# Patient Record
Sex: Female | Born: 1975 | Hispanic: Yes | Marital: Married | Smoking: Never smoker
Health system: Southern US, Community
[De-identification: ages and names within clinical notes are randomized; demographics above are authoritative.]

## PROBLEM LIST (undated history)

## (undated) DIAGNOSIS — F419 Anxiety disorder, unspecified: Secondary | ICD-10-CM

## (undated) HISTORY — PX: CHOLECYSTECTOMY: SHX55

---

## 2015-03-03 ENCOUNTER — Ambulatory Visit
Admission: EM | Admit: 2015-03-03 | Discharge: 2015-03-03 | Disposition: A | Discharge status: Home / Self Care | Payer: Self-pay | Attending: Family Medicine | Admitting: Family Medicine

## 2015-03-03 DIAGNOSIS — J0110 Acute frontal sinusitis, unspecified: Secondary | ICD-10-CM

## 2015-03-03 DIAGNOSIS — J0100 Acute maxillary sinusitis, unspecified: Secondary | ICD-10-CM

## 2015-03-03 LAB — RAPID INFLUENZA A&B ANTIGENS (ARMC ONLY)
INFLUENZA A (ARMC): NOT DETECTED
INFLUENZA B (ARMC): NOT DETECTED

## 2015-03-03 LAB — RAPID STREP SCREEN (MED CTR MEBANE ONLY): STREPTOCOCCUS, GROUP A SCREEN (DIRECT): NEGATIVE

## 2015-03-03 MED ORDER — GUAIFENESIN-CODEINE 100-10 MG/5ML PO SOLN: ORAL | Status: DC

## 2015-03-03 MED ORDER — AMOXICILLIN 875 MG PO TABS: 875.0000 mg | ORAL_TABLET | Freq: Two times a day (BID) | ORAL | Status: DC

## 2015-03-03 NOTE — ED Provider Notes (Signed)
CSN: [id number]     Arrival date & time 03/03/15  1539 History   First MD Initiated Contact with Patient 03/03/15 1619     Chief Complaint  Patient presents with  . URI   (Consider location/radiation/quality/duration/timing/severity/associated sxs/prior Treatment) Patient is a 40 y.o. female presenting with URI. The history is provided by the patient.  URI Presenting symptoms: congestion, cough, facial pain, fatigue, fever and sore throat   Severity:  Moderate Duration:  1 week Timing:  Constant Progression:  Worsening Chronicity:  New Relieved by:  Nothing Ineffective treatments:  OTC medications Associated symptoms: headaches   Associated symptoms: no wheezing   Risk factors: sick contacts   Risk factors: not elderly, no chronic cardiac disease, no chronic kidney disease, no chronic respiratory disease, no diabetes mellitus, no immunosuppression and no recent illness     History reviewed. No pertinent past medical history. Past Surgical History  Procedure Laterality Date  . Cholecystectomy    . Cesarean section     History reviewed. No pertinent family history. Social History  Substance Use Topics  . Smoking status: Never Smoker   . Smokeless tobacco: Never Used  . Alcohol Use: No   OB History    No data available     Review of Systems  Constitutional: Positive for fever and fatigue.  HENT: Positive for congestion and sore throat.   Respiratory: Positive for cough. Negative for wheezing.   Neurological: Positive for headaches.    Allergies  Review of patient's allergies indicates no known allergies.  Home Medications   Prior to Admission medications   Medication Sig Start Date End Date Taking? Authorizing Provider  amoxicillin (AMOXIL) 875 MG tablet Take 1 tablet (875 mg total) by mouth 2 (two) times daily. 03/03/15   Payton Mccallum, MD  guaiFENesin-codeine 100-10 MG/5ML syrup 10 ml po qhs prn cough 03/03/15   Payton Mccallum, MD   Meds Ordered and Administered  this Visit  Medications - No data to display  BP 134/60 mmHg  Pulse 87  Temp(Src) 98 F (36.7 C) (Oral)  Resp 18  Ht 5' (1.524 m)  Wt 130 lb (58.968 kg)  BMI 25.39 kg/m2  SpO2 99%  LMP 02/20/2015 No data found.   Physical Exam  Constitutional: She appears well-developed and well-nourished. No distress.  HENT:  Head: Normocephalic and atraumatic.  Right Ear: Tympanic membrane, external ear and ear canal normal.  Left Ear: Tympanic membrane, external ear and ear canal normal.  Nose: Mucosal edema and rhinorrhea present. No nose lacerations, sinus tenderness, nasal deformity, septal deviation or nasal septal hematoma. No epistaxis.  No foreign bodies. Right sinus exhibits maxillary sinus tenderness and frontal sinus tenderness. Left sinus exhibits maxillary sinus tenderness and frontal sinus tenderness.  Mouth/Throat: Uvula is midline, oropharynx is clear and moist and mucous membranes are normal. No oropharyngeal exudate.  Eyes: Conjunctivae and EOM are normal. Pupils are equal, round, and reactive to light. Right eye exhibits no discharge. Left eye exhibits no discharge. No scleral icterus.  Neck: Normal range of motion. Neck supple. No thyromegaly present.  Cardiovascular: Normal rate, regular rhythm and normal heart sounds.   Pulmonary/Chest: Effort normal and breath sounds normal. No respiratory distress. She has no wheezes. She has no rales.  Lymphadenopathy:    She has no cervical adenopathy.  Skin: She is not diaphoretic.  Nursing note and vitals reviewed.   ED Course  Procedures (including critical care time)  Labs Review Labs Reviewed  RAPID INFLUENZA A&B ANTIGENS ([facility] ONLY)  RAPID STREP SCREEN (NOT AT [facility])  CULTURE, GROUP A STREP [facility])    Imaging Review No results found.   Visual Acuity Review  Right Eye Distance:   Left Eye Distance:   Bilateral Distance:    Right Eye Near:   Left Eye Near:    Bilateral Near:         MDM   1. Acute maxillary  sinusitis, recurrence not specified   2. Acute frontal sinusitis, recurrence not specified    Discharge Medication List as of 03/03/2015  5:03 PM    START taking these medications   Details  amoxicillin (AMOXIL) 875 MG tablet Take 1 tablet (875 mg total) by mouth 2 (two) times daily., Starting 03/03/2015, Until Discontinued, Print    guaiFENesin-codeine 100-10 MG/5ML syrup 10 ml po qhs prn cough, Print       1. Labs and diagnosis reviewed with patient/parent/guardian/family 2. rx as per orders above; reviewed possible side effects, interactions, risks and benefits  3. Recommend supportive treatment with rest, increased fluids, otc analgesics prn  4. Follow-up prn if symptoms worsen or don't improve    Payton Mccallum, MD 03/03/15 1707

## 2015-03-03 NOTE — ED Notes (Signed)
Patient c/o cough, runny nose, sore throat, and fatigue which started 6 days ago.  Denies fever/v/n/chest pain but does have chills.

## 2015-03-05 LAB — CULTURE, GROUP A STREP (THRC)

## 2016-01-31 ENCOUNTER — Ambulatory Visit
Admission: EM | Admit: 2016-01-31 | Discharge: 2016-01-31 | Disposition: A | Discharge status: Home / Self Care | Payer: Self-pay | Attending: Family Medicine | Admitting: Family Medicine

## 2016-01-31 ENCOUNTER — Encounter: Payer: Self-pay | Admitting: Emergency Medicine

## 2016-01-31 ENCOUNTER — Ambulatory Visit (INDEPENDENT_AMBULATORY_CARE_PROVIDER_SITE_OTHER): Payer: Self-pay

## 2016-01-31 DIAGNOSIS — J4521 Mild intermittent asthma with (acute) exacerbation: Secondary | ICD-10-CM

## 2016-01-31 MED ORDER — PREDNISONE 20 MG PO TABS: ORAL_TABLET | ORAL | 0 refills | Status: DC

## 2016-01-31 MED ORDER — AZITHROMYCIN 250 MG PO TABS
ORAL_TABLET | ORAL | 0 refills | Status: DC
Start: 2016-01-31 — End: 2017-12-24

## 2016-01-31 MED ORDER — ALBUTEROL SULFATE HFA 108 (90 BASE) MCG/ACT IN AERS: 1.0000 | INHALATION_SPRAY | Freq: Four times a day (QID) | RESPIRATORY_TRACT | 0 refills | Status: AC | PRN

## 2016-01-31 MED ORDER — IPRATROPIUM-ALBUTEROL 0.5-2.5 (3) MG/3ML IN SOLN
3.0000 mL | Freq: Once | RESPIRATORY_TRACT | Status: AC
  Administered 2016-01-31: 3 mL via RESPIRATORY_TRACT

## 2016-01-31 MED ORDER — IBUPROFEN 600 MG PO TABS
600.0000 mg | ORAL_TABLET | Freq: Once | ORAL | Status: AC
  Administered 2016-01-31: 600 mg via ORAL

## 2016-01-31 NOTE — ED Triage Notes (Signed)
Cough, headache, congested for 2 weeks

## 2016-01-31 NOTE — ED Provider Notes (Signed)
CSN: [id number]     Arrival date & time 01/31/16  1226 History   First MD Initiated Contact with Patient 01/31/16 1502     Chief Complaint  Patient presents with  . Cough   (Consider location/radiation/quality/duration/timing/severity/associated sxs/prior Treatment) HPI  41 year old female presents with cough headache and congestion for 2 weeks duration. The cough has been present for [redacted] weeks along with wheezing and a headache has just started recently. She's not had any chills but has felt feverish at times. His never smoked and her O2 sats today are 95% on room air. Had wheezing throughout the entire 2 weeks. She relates that she has had asthma as a child. The coughing is much worse at nighttime along with wheezing. She states that her entire family has a history of asthmatic bronchitis.      History reviewed. No pertinent past medical history. Past Surgical History:  Procedure Laterality Date  . CESAREAN SECTION    . CHOLECYSTECTOMY     No family history on file. Social History  Substance Use Topics  . Smoking status: Never Smoker  . Smokeless tobacco: Never Used  . Alcohol use No   OB History    No data available     Review of Systems  Constitutional: Positive for activity change, fatigue and fever. Negative for chills.  HENT: Positive for congestion, sinus pain and sinus pressure.   Respiratory: Positive for cough, shortness of breath and wheezing. Negative for stridor.   Neurological: Positive for headaches.  All other systems reviewed and are negative.   Allergies  Patient has no known allergies.  Home Medications   Prior to Admission medications   Medication Sig Start Date End Date Taking? Authorizing Provider  albuterol (PROVENTIL HFA;VENTOLIN HFA) 108 (90 Base) MCG/ACT inhaler Inhale 1-2 puffs into the lungs every 6 (six) hours as needed for wheezing or shortness of breath. Use with spacer 01/31/16   Lutricia Feil, PA-C  azithromycin (ZITHROMAX Z-PAK) 250  MG tablet Take as per package instructions 01/31/16   Lutricia Feil, PA-C  predniSONE (DELTASONE) 20 MG tablet Take 2 tablets (40 mg) daily by mouth 01/31/16   Lutricia Feil, PA-C   Meds Ordered and Administered this Visit   Medications  ipratropium-albuterol (DUONEB) 0.5-2.5 (3) MG/3ML nebulizer solution 3 mL (3 mLs Nebulization Given 01/31/16 1532)  ibuprofen (ADVIL,MOTRIN) tablet 600 mg (600 mg Oral Given 01/31/16 1531)    BP 125/68 (BP Location: Left Arm)   Pulse 62   Temp 97.6 F (36.4 C) (Tympanic)   Resp 16   Ht 5' (1.524 m)   Wt 130 lb (59 kg)   LMP  (LMP Unknown) Comment: tubal ligation  SpO2 95%   BMI 25.39 kg/m  No data found.   Physical Exam  Constitutional: She is oriented to person, place, and time. She appears well-developed and well-nourished. No distress.  HENT:  Head: Normocephalic and atraumatic.  Right Ear: External ear normal.  Left Ear: External ear normal.  Nose: Nose normal.  Mouth/Throat: Oropharynx is clear and moist. No oropharyngeal exudate.  Eyes: EOM are normal. Pupils are equal, round, and reactive to light. Right eye exhibits no discharge. Left eye exhibits no discharge.  Neck: Normal range of motion. Neck supple.  Pulmonary/Chest: Effort normal. No respiratory distress. She has wheezes. She has no rales.  Musculoskeletal: Normal range of motion.  Lymphadenopathy:    She has no cervical adenopathy.  Neurological: She is alert and oriented to person, place, and time.  Skin:  Skin is warm and dry. She is not diaphoretic.  Psychiatric: She has a normal mood and affect. Her behavior is normal. Judgment and thought content normal.  Nursing note and vitals reviewed.   Urgent Care Course   Clinical Course     Procedures (including critical care time)  Labs Review Labs Reviewed - No data to display  Imaging Review Dg Chest 2 View  Result Date: 01/31/2016 CLINICAL DATA:  Cough for 2.5 weeks. EXAM: CHEST  2 VIEW COMPARISON:  None.  FINDINGS: Lungs clear. Heart size normal. No pneumothorax or pleural effusion. No bony abnormality. IMPRESSION: No acute disease. Electronically Signed   By: Drusilla Kannerhomas  Dalessio M.D.   On: 01/31/2016 15:27     Visual Acuity Review  Right Eye Distance:   Left Eye Distance:   Bilateral Distance:    Right Eye Near:   Left Eye Near:    Bilateral Near:     Medications  ipratropium-albuterol (DUONEB) 0.5-2.5 (3) MG/3ML nebulizer solution 3 mL (3 mLs Nebulization Given 01/31/16 1532)  ibuprofen (ADVIL,MOTRIN) tablet 600 mg (600 mg Oral Given 01/31/16 1531)   Patient had very good results with the treatment having much less wheezing and breathing easier.   MDM   1. Mild intermittent asthmatic bronchitis with acute exacerbation    New Prescriptions   ALBUTEROL (PROVENTIL HFA;VENTOLIN HFA) 108 (90 BASE) MCG/ACT INHALER    Inhale 1-2 puffs into the lungs every 6 (six) hours as needed for wheezing or shortness of breath. Use with spacer   AZITHROMYCIN (ZITHROMAX Z-PAK) 250 MG TABLET    Take as per package instructions   PREDNISONE (DELTASONE) 20 MG TABLET    Take 2 tablets (40 mg) daily by mouth  Plan: 1. Test/x-ray results and diagnosis reviewed with patient 2. rx as per orders; risks, benefits, potential side effects reviewed with patient 3. Recommend supportive treatment with Use of albuterol for her shortness of breath and wheezing. We'll give her a short course of prednisone for the bronchitis. Recommended that she follow-up with her primary care physician next week for further evaluation and treatment 4. F/u prn if symptoms worsen or don't improve     Lutricia FeilWilliam P Dahiana Kulak, PA-C 01/31/16 1606

## 2017-12-24 ENCOUNTER — Other Ambulatory Visit: Payer: Self-pay

## 2017-12-24 ENCOUNTER — Ambulatory Visit
Admission: EM | Admit: 2017-12-24 | Discharge: 2017-12-24 | Disposition: A | Discharge status: Home / Self Care | Payer: Self-pay | Attending: Family Medicine | Admitting: Family Medicine

## 2017-12-24 DIAGNOSIS — W540XXA Bitten by dog, initial encounter: Secondary | ICD-10-CM

## 2017-12-24 DIAGNOSIS — S61051A Open bite of right thumb without damage to nail, initial encounter: Secondary | ICD-10-CM

## 2017-12-24 NOTE — ED Provider Notes (Signed)
[facility] ____________________________________________  Time seen: Approximately 6:18 PM  I have reviewed the triage vital signs and the nursing notes.   HISTORY  Chief Complaint Animal Bite   HPI Sydney Reed is a 42 y.o. female presenting for evaluation of right thumb wound due to dog bite that occurred last night.  States she and her husband were helping to disimpact her 7 pound shih-tzu, and states that her dog turned and bit once at her right hand.  States the dog did not actually bite down on her thumb, but states that she believes the tooth hit it and as she quickly jerked her hand away caused the skin injury.  States mild pain to the thumb at skin injury site only.  Denies any drainage, paresthesias, decreased range of motion.  Reports she has had mild bleeding intermittently since.  Has cleaned it at home and applied topical antibiotic ointment.  Patient's last tetanus immunization was in 2017.  Reports her dog is up-to-date on immunizations including rabies immunizations.  Denies any other skin changes or injury.  Left hand dominant.  States mild pain currently.   Patient reports that she has a history of asthma and bronchitis, and she is currently sick with bronchitis and is currently on antibiotics and prednisone.  States that she was seen by her doctor on 12/20/2017 and started on a 10-day course of Augmentin as well as prednisone, and states that she is feeling much better.  Denies chest pain or current shortness of breath.  [place]: PCP    History reviewed. No pertinent past medical history.  There are no active problems to display for this patient.   Past Surgical History:  Procedure Laterality Date  . CESAREAN SECTION    . CHOLECYSTECTOMY       No current facility-administered medications for this encounter.   Current Outpatient Medications:  .  albuterol (PROVENTIL HFA;VENTOLIN HFA) 108 (90 Base) MCG/ACT inhaler, Inhale 1-2  puffs into the lungs every 6 (six) hours as needed for wheezing or shortness of breath. Use with spacer, Disp: 1 Inhaler, Rfl: 0 .  amoxicillin-clavulanate (AUGMENTIN) 875-125 MG tablet, Take 1 tablet by mouth every 12 (twelve) hours., Disp: , Rfl: 0 .  fexofenadine (ALLEGRA) 180 MG tablet, Take by mouth., Disp: , Rfl:  .  naproxen (NAPROSYN) 500 MG tablet, Take 500 mg by mouth 2 (two) times daily with a meal., Disp: , Rfl: 0 .  predniSONE (DELTASONE) 20 MG tablet, Take 2 tablets (40 mg) daily by mouth, Disp: 8 tablet, Rfl: 0 .  sertraline (ZOLOFT) 50 MG tablet, Take 50 mg by mouth daily., Disp: , Rfl: 3  Allergies Patient has no known allergies.  History reviewed. No pertinent family history.  Social History Social History   Tobacco Use  . Smoking status: Never Smoker  . Smokeless tobacco: Never Used  Substance Use Topics  . Alcohol use: No  . Drug use: No    Review of Systems Constitutional: No fever Cardiovascular: Denies chest pain. Respiratory: Denies shortness of breath. Gastrointestinal: No abdominal pain.   Musculoskeletal: Right thumb pain. Skin: as above.  ____________________________________________   PHYSICAL EXAM:  VITAL SIGNS: ED Triage Vitals  Enc Vitals Group     BP 12/24/17 1737 136/78     Pulse Rate 12/24/17 1737 72     Resp 12/24/17 1737 18     Temp 12/24/17 1737 (!) 97.5 F (36.4 C)     Temp Source 12/24/17 1737 Oral  SpO2 12/24/17 1737 99 %     Weight 12/24/17 1737 139 lb (63 kg)     Height 12/24/17 1737 5' (1.524 m)     Head Circumference --      Peak Flow --      Pain Score 12/24/17 1736 5     Pain Loc --      Pain Edu? --      Excl. in [place]? --     Constitutional: Alert and oriented. Well appearing and in no acute distress. ENT      Head: Normocephalic and atraumatic. Cardiovascular: Normal rate, regular rhythm. Grossly normal heart sounds.  Good peripheral circulation. Respiratory: Normal respiratory effort without tachypnea nor  retractions.  Minimal scattered inspiratory and expiratory wheezes.  No rhonchi.  Speaks in complete sentences. Musculoskeletal: Steady gait.  See skin below. Neurologic:  Normal speech and language. No gross focal neurologic deficits are appreciated. Speech is normal. No gait instability.  Skin:  Skin is warm, dry.  Except:      1.25 cm superficial break in skin as depicted above along right thumb base of nail, mild tenderness to direct palpation, no tenderness from the volar aspect of distal phalanx, no pain with resisted thumb flexion or extension, no clear bony tenderness, no exudate, minimal erythema, minimal localized edema.  Right hand thumb normal distal sensation and capillary refill, no motor or tendon deficit. Psychiatric: Mood and affect are normal. Speech and behavior are normal. Patient exhibits appropriate insight and judgment   ___________________________________________   LABS (all labs ordered are listed, but only abnormal results are displayed)  Labs Reviewed - No data to display ____________________________________________   PROCEDURES Procedures   Wound cleaned and irrigated by nursing staff, bandage applied.  INITIAL IMPRESSION / ASSESSMENT AND PLAN / ED COURSE  Pertinent labs & imaging results that were available during my care of the patient were reviewed by me and considered in my medical decision making (see chart for details).  Well-appearing patient.  No acute distress.  Dog bite to right thumb.  Occurred by her dog.  Reports dog is up-to-date on rabies immunization.  Patient reports she is up-to-date on tetanus with last tetanus immunization being 2 years ago.  Right thumb abrasion like injury to the distal phalanx at the base of nail, area cleaned.  Patient with localized tenderness dorsally, but no clear bony tenderness, and patient states no pain with range of motion, and further reports that her dog is 7 pounds.  Will defer x-ray at this time, patient  agrees to this.  Patient is already on Augmentin and at the time of dog bite last night she had 7 days remaining.  Tetanus immunization is up-to-date.  Discuss prescribing topical Bactroban, patient then states that that is what she is using as her husband had leftover and declines further prescription.  Encourage supportive care, keeping clean and monitoring.  Animal control notified by nursing staff.  Discussed follow up with Primary care physician this week. Discussed follow up and return parameters including no resolution or any worsening concerns. Patient verbalized understanding and agreed to plan.   ____________________________________________   FINAL CLINICAL IMPRESSION(S) / ED DIAGNOSES  Final diagnoses:  Dog bite of right thumb, initial encounter     ED Discharge Orders    None       Note: This dictation was prepared with Dragon dictation along with smaller phrase technology. Any transcriptional errors that result from this process are unintentional.  Renford Dills, NP 12/24/17 1909

## 2017-12-24 NOTE — ED Triage Notes (Signed)
Patient complains of dog bite that occurred last night by her own dog. Patient has a small bite to her thumb. Patient states that she is already on amoxicillin and prednisone due to bronchitis. Patient states that this occurred at her house while she was helping disimpact her dog.

## 2017-12-24 NOTE — Discharge Instructions (Addendum)
Continue to clean with soap and water.  Continue Augmentin as well as topical mupirocin twice a day.  Monitor.  Follow up with your primary care physician this week as needed. Return to Urgent care for new or worsening concerns.

## 2018-07-13 ENCOUNTER — Ambulatory Visit
Admission: EM | Admit: 2018-07-13 | Discharge: 2018-07-13 | Disposition: A | Discharge status: Home / Self Care | Payer: Self-pay | Attending: Family Medicine | Admitting: Family Medicine

## 2018-07-13 ENCOUNTER — Encounter: Payer: Self-pay | Admitting: Emergency Medicine

## 2018-07-13 ENCOUNTER — Other Ambulatory Visit: Payer: Self-pay

## 2018-07-13 DIAGNOSIS — M7918 Myalgia, other site: Secondary | ICD-10-CM

## 2018-07-13 DIAGNOSIS — X500XXA Overexertion from strenuous movement or load, initial encounter: Secondary | ICD-10-CM

## 2018-07-13 HISTORY — DX: Anxiety disorder, unspecified: F41.9

## 2018-07-13 MED ORDER — MELOXICAM 15 MG PO TABS: 15.0000 mg | ORAL_TABLET | Freq: Every day | ORAL | 0 refills | Status: DC | PRN

## 2018-07-13 MED ORDER — TRAMADOL HCL 50 MG PO TABS: 50.0000 mg | ORAL_TABLET | Freq: Three times a day (TID) | ORAL | 0 refills | Status: DC | PRN

## 2018-07-13 NOTE — ED Provider Notes (Signed)
[facility]    CSN: [id number] Arrival date & time: 07/13/18  1132  History   Chief Complaint Chief Complaint  Patient presents with  . Abdominal Pain   HPI  43 year old female presents with left-sided rib/abdominal pain and back pain.  Started approximately 1 week ago but worsened over the weekend.  Patient attributes this to frequent bending and lifting at work.  She states that it is worse with certain ranges of motion.  Tender to palpation.  No medications or interventions tried.  No reports of nausea or vomiting.  No fever.  Patient states that her pain is currently 7/10 in severity.  No other reported symptoms.  No other complaints.  PMH, Surgical Hx, Family Hx, Social History reviewed and updated as below.   Past Medical History:  Diagnosis Date  . Anxiety    Past Surgical History:  Procedure Laterality Date  . CESAREAN SECTION    . CHOLECYSTECTOMY      OB History   No obstetric history on file.      Home Medications    Prior to Admission medications   Medication Sig Start Date End Date Taking? Authorizing Provider  albuterol (PROVENTIL HFA;VENTOLIN HFA) 108 (90 Base) MCG/ACT inhaler Inhale 1-2 puffs into the lungs every 6 (six) hours as needed for wheezing or shortness of breath. Use with spacer 01/31/16  Yes Lorin Picket, PA-C  fexofenadine (ALLEGRA) 180 MG tablet Take by mouth.   Yes [provider]  sertraline (ZOLOFT) 50 MG tablet Take 50 mg by mouth daily. 12/17/17  Yes [provider]  meloxicam (MOBIC) 15 MG tablet Take 1 tablet (15 mg total) by mouth daily as needed for pain. 07/13/18   Coral Spikes, DO  traMADol (ULTRAM) 50 MG tablet Take 1 tablet (50 mg total) by mouth every 8 (eight) hours as needed for severe pain. 07/13/18   Coral Spikes, DO    Family History Family History  Problem Relation Age of Onset  . Diabetes Mother   . Hypertension Mother   . Cancer Father     Social History Social History    Tobacco Use  . Smoking status: Never Smoker  . Smokeless tobacco: Never Used  Substance Use Topics  . Alcohol use: No  . Drug use: No     Allergies   Patient has no known allergies.   Review of Systems Review of Systems  Constitutional: Negative.   Musculoskeletal:       Rib pain, back pain.   Physical Exam Triage Vital Signs ED Triage Vitals  Enc Vitals Group     BP 07/13/18 1205 119/68     Pulse Rate 07/13/18 1205 67     Resp 07/13/18 1205 16     Temp 07/13/18 1205 98.1 F (36.7 C)     Temp Source 07/13/18 1205 Oral     SpO2 07/13/18 1205 100 %     Weight 07/13/18 1208 139 lb (63 kg)     Height 07/13/18 1208 5' (1.524 m)     Head Circumference --      Peak Flow --      Pain Score 07/13/18 1207 7     Pain Loc --      Pain Edu? --      Excl. in [place]? --    Updated Vital Signs BP 119/68   Pulse 67   Temp 98.1 F (36.7 C) (Oral)   Resp 16   Ht 5' (1.524 m)  Wt 63 kg   LMP 06/29/2018 (Exact Date)   SpO2 100%   BMI 27.15 kg/m   Visual Acuity Right Eye Distance:   Left Eye Distance:   Bilateral Distance:    Right Eye Near:   Left Eye Near:    Bilateral Near:     Physical Exam Vitals signs and nursing note reviewed.  Constitutional:      General: She is not in acute distress.    Appearance: Normal appearance.  HENT:     Head: Normocephalic and atraumatic.  Eyes:     General:        Right eye: No discharge.        Left eye: No discharge.     Conjunctiva/sclera: Conjunctivae normal.  Cardiovascular:     Rate and Rhythm: Normal rate and regular rhythm.  Pulmonary:     Effort: Pulmonary effort is normal.     Breath sounds: Normal breath sounds.  Musculoskeletal:     Comments: Tenderness over the left lower ribs.  Patient also has left-sided lower thoracic tenderness to palpation.  Neurological:     Mental Status: She is alert.  Psychiatric:        Mood and Affect: Mood normal.        Behavior: Behavior normal.    UC Treatments / Results   Labs (all labs ordered are listed, but only abnormal results are displayed) Labs Reviewed - No data to display  EKG None  Radiology No results found.  Procedures Procedures (including critical care time)  Medications Ordered in UC Medications - No data to display  Initial Impression / Assessment and Plan / UC Course  I have reviewed the triage vital signs and the nursing notes.  Pertinent labs & imaging results that were available during my care of the patient were reviewed by me and considered in my medical decision making (see chart for details).    43 year old female presents with musculoskeletal pain.  Treating with meloxicam and tramadol.  Supportive care.  Final Clinical Impressions(s) / UC Diagnoses   Final diagnoses:  Musculoskeletal pain     Discharge Instructions     Rest.  Heat.  Medications as directed.  Take care  Dr. Adriana Simasook    ED Prescriptions    Medication Sig Dispense Auth. Provider   meloxicam (MOBIC) 15 MG tablet Take 1 tablet (15 mg total) by mouth daily as needed for pain. 30 tablet Karam Dunson G, DO   traMADol (ULTRAM) 50 MG tablet Take 1 tablet (50 mg total) by mouth every 8 (eight) hours as needed for severe pain. 10 tablet Tommie Samsook, Maksymilian Mabey G, DO     Controlled Substance Prescriptions [place] Controlled Substance Registry consulted? Not Applicable   Tommie SamsCook, Rutilio Yellowhair G, DO 07/13/18 1514

## 2018-07-13 NOTE — Discharge Instructions (Signed)
Rest.  Heat.  Medications as directed.  Take care  Dr. Yosef Krogh  

## 2018-07-13 NOTE — ED Triage Notes (Signed)
Patient states she has had left abdominal pain for a week. States she did a lot of lifting and bending at work and then developed the pain.

## 2018-07-14 ENCOUNTER — Other Ambulatory Visit: Payer: Self-pay

## 2018-07-14 ENCOUNTER — Ambulatory Visit
Admission: EM | Admit: 2018-07-14 | Discharge: 2018-07-14 | Disposition: A | Discharge status: Home / Self Care | Payer: Self-pay | Attending: Emergency Medicine | Admitting: Emergency Medicine

## 2018-07-14 ENCOUNTER — Encounter: Payer: Self-pay | Admitting: Emergency Medicine

## 2018-07-14 DIAGNOSIS — T887XXA Unspecified adverse effect of drug or medicament, initial encounter: Secondary | ICD-10-CM

## 2018-07-14 DIAGNOSIS — R42 Dizziness and giddiness: Secondary | ICD-10-CM

## 2018-07-14 DIAGNOSIS — R110 Nausea: Secondary | ICD-10-CM

## 2018-07-14 MED ORDER — IBUPROFEN 600 MG PO TABS: 600.0000 mg | ORAL_TABLET | Freq: Four times a day (QID) | ORAL | 0 refills | Status: AC | PRN

## 2018-07-14 MED ORDER — ONDANSETRON 4 MG PO TBDP: 4.0000 mg | ORAL_TABLET | Freq: Three times a day (TID) | ORAL | 0 refills | Status: AC | PRN

## 2018-07-14 MED ORDER — TIZANIDINE HCL 4 MG PO TABS
4.0000 mg | ORAL_TABLET | Freq: Three times a day (TID) | ORAL | 0 refills | Status: AC | PRN
Start: 2018-07-14 — End: ?

## 2018-07-14 MED ORDER — EPINEPHRINE 0.3 MG/0.3ML IJ SOAJ: 0.3000 mg | Freq: Once | INTRAMUSCULAR | 0 refills | Status: AC

## 2018-07-14 NOTE — ED Provider Notes (Signed)
HPI  SUBJECTIVE:  Sydney Reed is a 43 y.o. female who presents with leg itching starting 30 minutes after taking tramadol and meloxicam last night.  Took another dose of tramadol at 10 PM, and had persistent itching.  She took tramadol and meloxicam this morning with food and 2 hours later she started feeling lightheaded, dizzy with chills, nausea, headache, diarrhea and persistent itching.  No fevers, vomiting, rash, erythema, hives, lip or tongue swelling, difficulty breathing, wheezing, shortness of breath, sensation of throat swelling shut, abdominal pain, syncope.  No seizures.  The pain that she was seen here for yesterday has not changed.  No new foods, other new medications.  She takes Care[employer] adviserAllegra and Zoloft on a regular basis.  She tried drinking water without improvement in her symptoms.  No other aggravating or alleviating factors.  Past medical history of allergies, anxiety.  No history of anaphylaxis.  She has tolerated NSAIDs including ibuprofen well in the past.  Patient was seen here for left-sided rib/abdominal and back pain.  Thought to have a musculoskeletal cause, sent home with meloxicam and tramadol.  Past Medical History:  Diagnosis Date  . Anxiety     Past Surgical History:  Procedure Laterality Date  . CESAREAN SECTION    . CHOLECYSTECTOMY      Family History  Problem Relation Age of Onset  . Diabetes Mother   . Hypertension Mother   . Cancer Father     Social History   Tobacco Use  . Smoking status: Never Smoker  . Smokeless tobacco: Never Used  Substance Use Topics  . Alcohol use: No  . Drug use: No    No current facility-administered medications for this encounter.   Current Outpatient Medications:  .  albuterol (PROVENTIL HFA;VENTOLIN HFA) 108 (90 Base) MCG/ACT inhaler, Inhale 1-2 puffs into the lungs every 6 (six) hours as needed for wheezing or shortness of breath. Use with spacer, Disp: 1 Inhaler, Rfl: 0 .  fexofenadine (ALLEGRA) 180 MG tablet,  Take by mouth., Disp: , Rfl:  .  sertraline (ZOLOFT) 50 MG tablet, Take 50 mg by mouth daily., Disp: , Rfl: 3 .  EPINEPHrine 0.3 mg/0.3 mL IJ SOAJ injection, Inject 0.3 mLs (0.3 mg total) into the muscle once for 1 dose., Disp: 1 each, Rfl: 0 .  ibuprofen (ADVIL) 600 MG tablet, Take 1 tablet (600 mg total) by mouth every 6 (six) hours as needed., Disp: 30 tablet, Rfl: 0 .  ondansetron (ZOFRAN ODT) 4 MG disintegrating tablet, Take 1 tablet (4 mg total) by mouth every 8 (eight) hours as needed for nausea or vomiting., Disp: 20 tablet, Rfl: 0 .  tiZANidine (ZANAFLEX) 4 MG tablet, Take 1 tablet (4 mg total) by mouth every 8 (eight) hours as needed for muscle spasms., Disp: 30 tablet, Rfl: 0  No Known Allergies   ROS  As noted in HPI.   Physical Exam  BP 128/78 (BP Location: Right Arm)   Pulse 82   Temp 97.8 F (36.6 C) (Oral)   Resp 18   Ht 5' (1.524 m)   Wt 63 kg   LMP 06/29/2018 (Exact Date)   SpO2 100%   BMI 27.15 kg/m   Constitutional: Well developed, well nourished, no acute distress Eyes:  EOMI, conjunctiva normal bilaterally HENT: Normocephalic, atraumatic,mucus membranes moist.  No angioedema of the lips or tongue.  Airway widely patent. Respiratory: Normal inspiratory effort lungs clear bilaterally. Cardiovascular: Normal rate, regular rhythm, no murmurs rubs or gallops. GI: nondistended soft, nontender, active  bowel sounds.   skin: No rash, urticaria, erythema over the torso, extremities or face. Musculoskeletal: no deformities Neurologic: Alert & oriented x 3, no focal neuro deficits Psychiatric: Speech and behavior appropriate   ED Course   Medications - No data to display  No orders of the defined types were placed in this encounter.   No results found for this or any previous visit (from the past 24 hour(s)). No results found.  ED Clinical Impression  1. Side effect of medication      ED Assessment/Plan  Previous records from this office reviewed.   As noted in HPI.  Suspect symptoms are side effects from the tramadol.  No evidence of anaphylaxis at this time.  She has tolerated NSAIDs including ibuprofen without any problem previously.  Discontinue the Mobic, tramadol.  Will send home with ibuprofen 600 mg, with 1 g of Tylenol together 3-4 times a day for musculoskeletal pain, Zanaflex as well.  Zofran in case she has persistent nausea although I suspect that the nausea will resolve as the drug gets out of her system.  Also an EpiPen in case she has a anaphylaxis.  Instructed her to use this, take 50 milligrams of Benadryl and go to the ER if this happens.  Discussed MDM, treatment plan, and plan for follow-up with patient. Discussed sn/sx that should prompt return to the ED. patient agrees with plan.   Meds ordered this encounter  Medications  . ondansetron (ZOFRAN ODT) 4 MG disintegrating tablet    Sig: Take 1 tablet (4 mg total) by mouth every 8 (eight) hours as needed for nausea or vomiting.    Dispense:  20 tablet    Refill:  0  . ibuprofen (ADVIL) 600 MG tablet    Sig: Take 1 tablet (600 mg total) by mouth every 6 (six) hours as needed.    Dispense:  30 tablet    Refill:  0  . tiZANidine (ZANAFLEX) 4 MG tablet    Sig: Take 1 tablet (4 mg total) by mouth every 8 (eight) hours as needed for muscle spasms.    Dispense:  30 tablet    Refill:  0  . EPINEPHrine 0.3 mg/0.3 mL IJ SOAJ injection    Sig: Inject 0.3 mLs (0.3 mg total) into the muscle once for 1 dose.    Dispense:  1 each    Refill:  0    *This clinic note was created using [employer]. Therefore, there may be occasional mistakes despite careful proofreading.   ?    Melynda Ripple, MD 07/14/18 1108

## 2018-07-14 NOTE — Discharge Instructions (Addendum)
The Zofran will help with nausea, however as the tramadol wears off, you should be feeling better.  Take 600 mg ibuprofen combined with 1 g of Tylenol 3-4 times a day for inflammation and pain.  Zanaflex as a muscle relaxant will also help with pain.  I am sending you home with an EpiPen in case you do have a severe allergic reaction, such as tongue or lip swelling, hives everywhere, difficulty breathing.  Take 50 mg of Benadryl, use the EpiPen, and go to the emergency department.

## 2018-07-14 NOTE — ED Triage Notes (Signed)
Patient states she was seen here yesterday for side pain and diagnosed with muscle strain.  Patient was given Meloxicam and Tramadol. She states she started itching after she took the medications. She states she took both of the pills this morning and stated she started having chills, diarrhea and nausea. Patient states she was feeling fine until she took these medications.

## 2018-08-03 ENCOUNTER — Other Ambulatory Visit: Payer: Self-pay

## 2018-08-03 DIAGNOSIS — Z20822 Contact with and (suspected) exposure to covid-19: Secondary | ICD-10-CM

## 2018-08-07 LAB — NOVEL CORONAVIRUS, NAA: SARS-CoV-2, NAA: NOT DETECTED

## 2018-08-13 IMAGING — CR DG CHEST 2V
2 series · 2 of 2 positions shown · non-contrast
Comparison: None.

CLINICAL DATA: Cough for 2.5 weeks.

EXAM:
CHEST  2 VIEW

[chest pa]
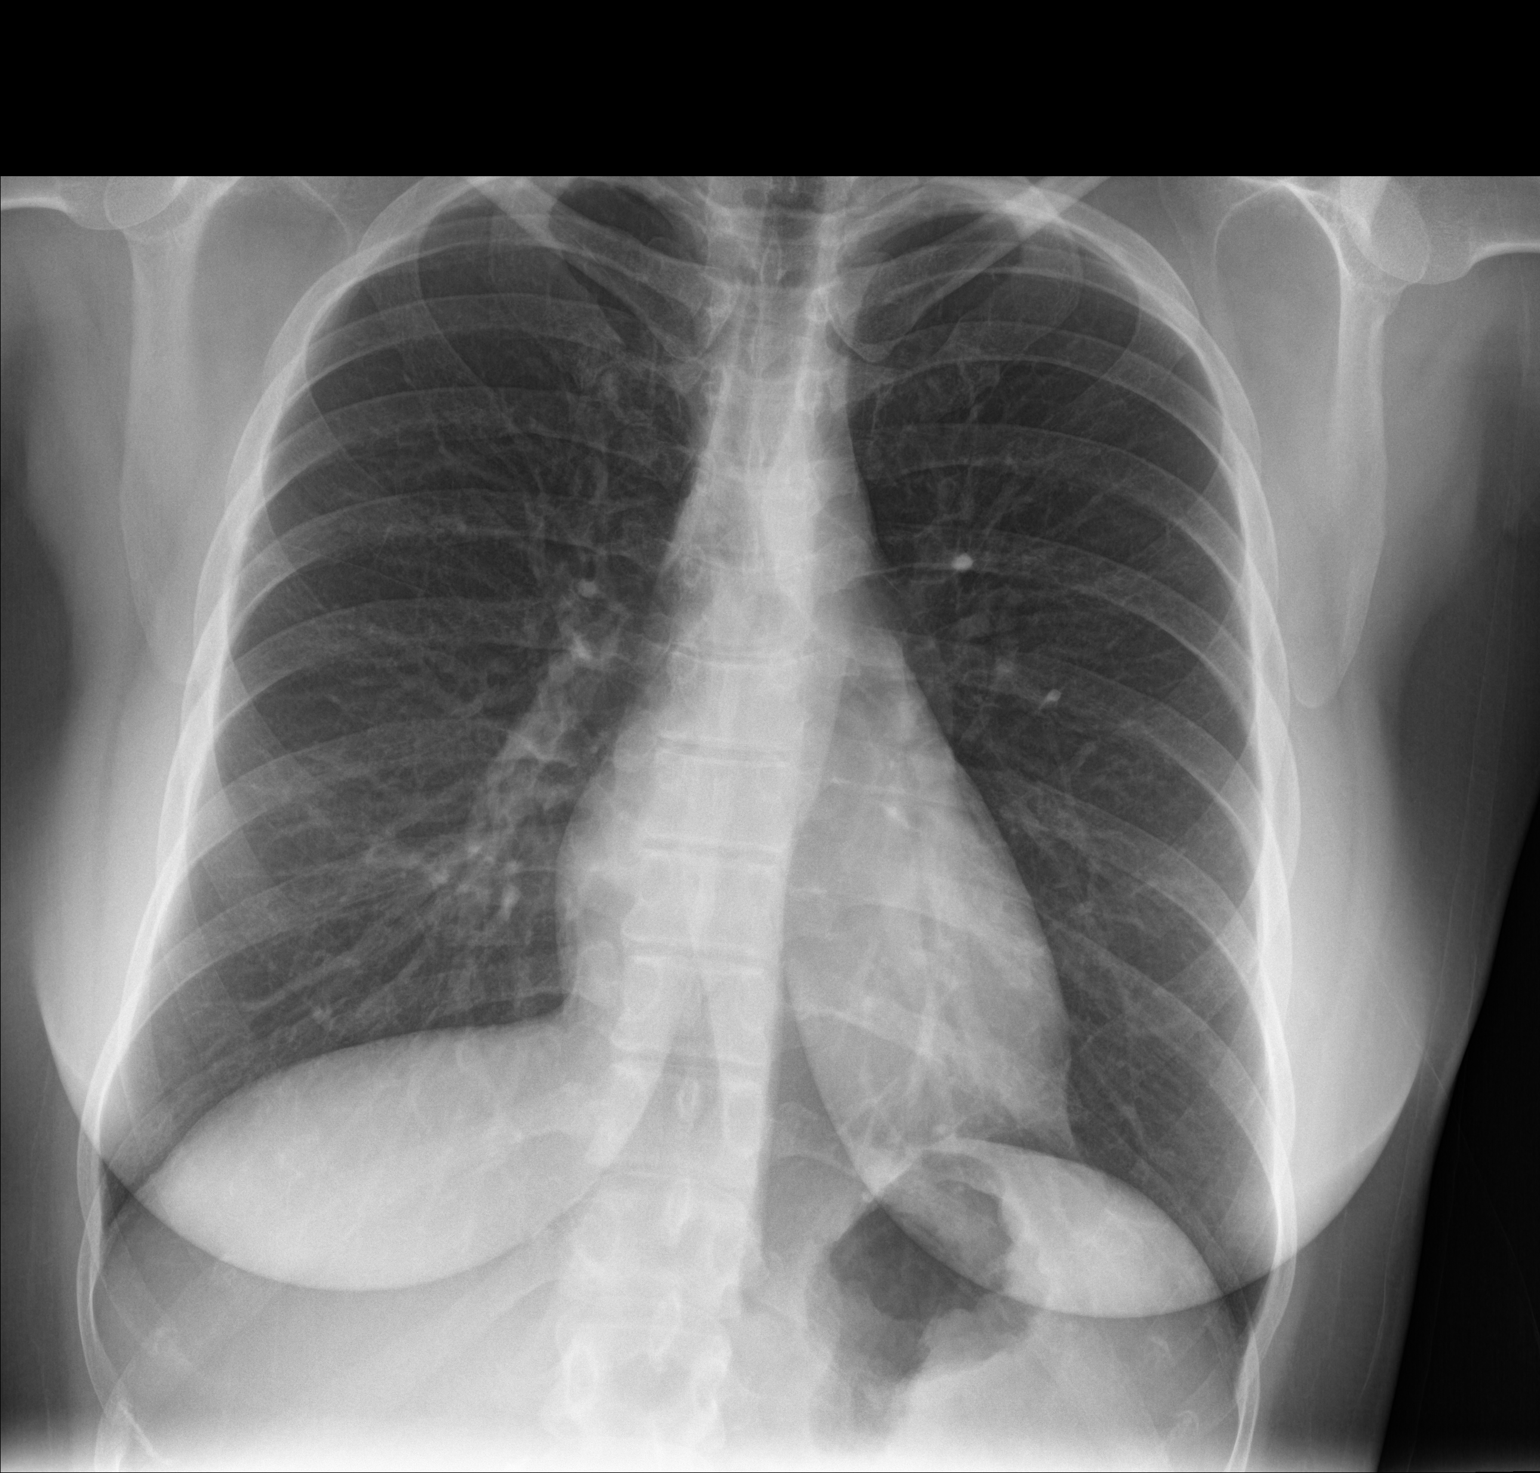

[chest lat]
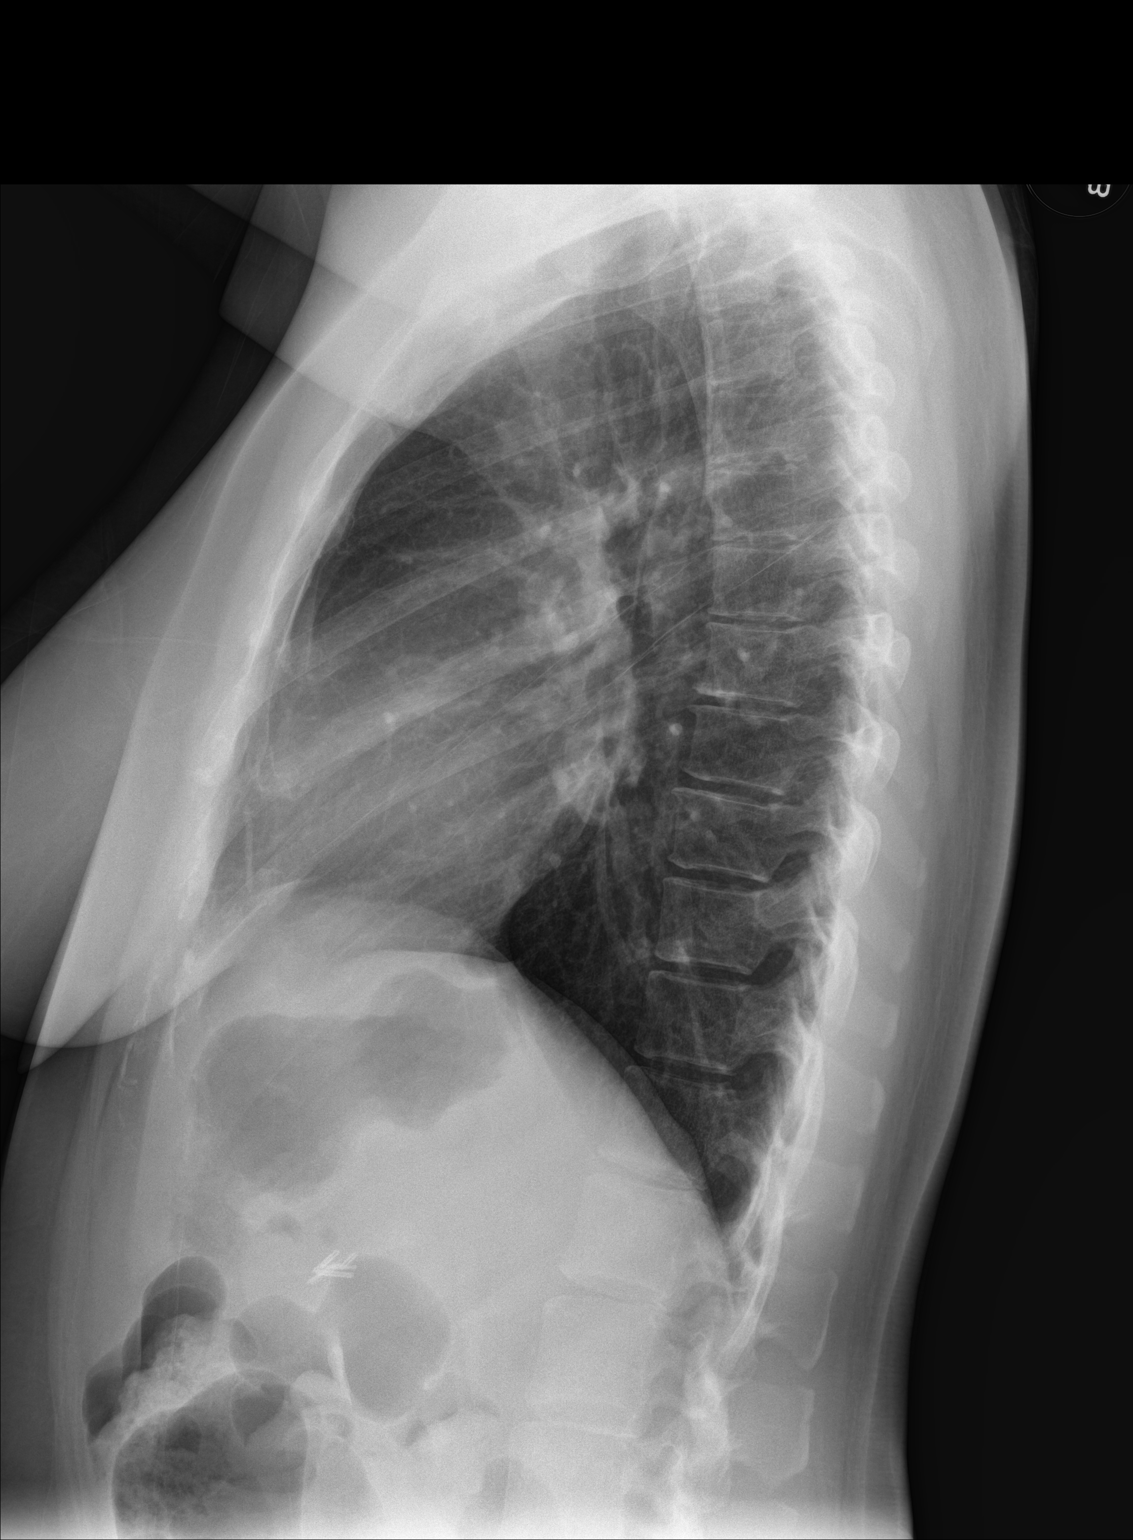

[2 of 2 positions shown; findings below may reference images not displayed]

FINDINGS: Lungs clear. Heart size normal. No pneumothorax or pleural effusion.
No bony abnormality.
IMPRESSION: No acute disease.

## 2018-09-12 ENCOUNTER — Other Ambulatory Visit: Payer: Self-pay | Admitting: Medical Oncology

## 2018-09-12 DIAGNOSIS — Z1231 Encounter for screening mammogram for malignant neoplasm of breast: Secondary | ICD-10-CM

## 2019-04-15 ENCOUNTER — Ambulatory Visit: Payer: Self-pay | Attending: Internal Medicine

## 2019-04-15 DIAGNOSIS — Z23 Encounter for immunization: Secondary | ICD-10-CM

## 2019-04-15 NOTE — Progress Notes (Signed)
   Covid-19 Vaccination Clinic  Name:  Sydney Reed    MRN: [id number] DOB: [birth year]  04/15/2019  [name] was observed post Covid-19 immunization for 15 minutes without incident. She was provided with Vaccine Information Sheet and instruction to access the V-Safe system.   [name] was instructed to call 911 with any severe reactions post vaccine: Marland Kitchen Difficulty breathing  . Swelling of face and throat  . A fast heartbeat  . A bad rash all over body  . Dizziness and weakness   Immunizations Administered    Name Date Dose VIS Date Route   Pfizer COVID-19 Vaccine 04/15/2019  4:22 PM 0.3 mL 12/30/2018 Intramuscular   Manufacturer: [employer], [employer]   Lot: [id number]   NDC: [address]-5

## 2019-05-06 ENCOUNTER — Ambulatory Visit: Payer: Self-pay

## 2020-03-09 ENCOUNTER — Ambulatory Visit: Payer: Self-pay

## 2020-03-09 ENCOUNTER — Encounter (HOSPITAL_COMMUNITY): Payer: Self-pay

## 2020-03-09 ENCOUNTER — Other Ambulatory Visit: Payer: Self-pay

## 2020-03-09 ENCOUNTER — Ambulatory Visit (HOSPITAL_COMMUNITY)
Admission: EM | Admit: 2020-03-09 | Discharge: 2020-03-09 | Disposition: A | Discharge status: Home / Self Care | Payer: 59 | Attending: Urgent Care | Admitting: Urgent Care

## 2020-03-09 DIAGNOSIS — L03213 Periorbital cellulitis: Secondary | ICD-10-CM | POA: Diagnosis not present

## 2020-03-09 MED ORDER — NAPROXEN 500 MG PO TABS: 500.0000 mg | ORAL_TABLET | Freq: Two times a day (BID) | ORAL | 0 refills | Status: AC

## 2020-03-09 MED ORDER — CEFDINIR 300 MG PO CAPS: 300.0000 mg | ORAL_CAPSULE | Freq: Two times a day (BID) | ORAL | 0 refills | Status: AC

## 2020-03-09 NOTE — ED Provider Notes (Signed)
[facility]   MRN: [id number] DOB: Jun 27, 1975  Subjective:   Sydney Reed is a 45 y.o. female presenting for 1 week history of persistent and worsening right upper eyelid pain and swelling.  Symptoms started after she went to a salon where she had some eyelash in her and eyebrows work done.  Denies vision change, photophobia, fever, nausea, vomiting.  No current facility-administered medications for this encounter.  Current Outpatient Medications:  .  albuterol (PROVENTIL HFA;VENTOLIN HFA) 108 (90 Base) MCG/ACT inhaler, Inhale 1-2 puffs into the lungs every 6 (six) hours as needed for wheezing or shortness of breath. Use with spacer, Disp: 1 Inhaler, Rfl: 0 .  fexofenadine (ALLEGRA) 180 MG tablet, Take by mouth., Disp: , Rfl:  .  ibuprofen (ADVIL) 600 MG tablet, Take 1 tablet (600 mg total) by mouth every 6 (six) hours as needed., Disp: 30 tablet, Rfl: 0 .  ondansetron (ZOFRAN ODT) 4 MG disintegrating tablet, Take 1 tablet (4 mg total) by mouth every 8 (eight) hours as needed for nausea or vomiting., Disp: 20 tablet, Rfl: 0 .  sertraline (ZOLOFT) 50 MG tablet, Take 50 mg by mouth daily., Disp: , Rfl: 3 .  tiZANidine (ZANAFLEX) 4 MG tablet, Take 1 tablet (4 mg total) by mouth every 8 (eight) hours as needed for muscle spasms., Disp: 30 tablet, Rfl: 0   No Known Allergies  Past Medical History:  Diagnosis Date  . Anxiety      Past Surgical History:  Procedure Laterality Date  . CESAREAN SECTION    . CHOLECYSTECTOMY      Family History  Problem Relation Age of Onset  . Diabetes Mother   . Hypertension Mother   . Cancer Father     Social History   Tobacco Use  . Smoking status: Never Smoker  . Smokeless tobacco: Never Used  Vaping Use  . Vaping Use: Never used  Substance Use Topics  . Alcohol use: No  . Drug use: No    ROS   Objective:   Vitals: BP 130/66 (BP Location: Left Arm)   Pulse 72   Temp 98.9 F (37.2 C) (Oral)   Resp 18   SpO2  97%   Physical Exam Constitutional:      General: She is not in acute distress.    Appearance: Normal appearance. She is well-developed. She is not ill-appearing.  HENT:     Head: Normocephalic and atraumatic.     Nose: Nose normal.     Mouth/Throat:     Mouth: Mucous membranes are moist.     Pharynx: Oropharynx is clear.  Eyes:     General: Lids are everted, no foreign bodies appreciated. No scleral icterus.       Right eye: No foreign body, discharge or hordeolum.     Extraocular Movements: Extraocular movements intact.     Conjunctiva/sclera:     Right eye: Right conjunctiva is not injected. No chemosis, exudate or hemorrhage.    Pupils: Pupils are equal, round, and reactive to light.   Cardiovascular:     Rate and Rhythm: Normal rate.  Pulmonary:     Effort: Pulmonary effort is normal.  Skin:    General: Skin is warm and dry.  Neurological:     General: No focal deficit present.     Mental Status: She is alert and oriented to person, place, and time.  Psychiatric:        Mood and Affect: Mood normal.  Behavior: Behavior normal.       Assessment and Plan :   PDMP not reviewed this encounter.  1. Preseptal cellulitis of right upper eyelid     Start cefdinir to address preseptal cellulitis, naproxen for pain and inflammation.  Emphasized need for close follow-up within 48 hours. Counseled patient on potential for adverse effects with medications prescribed/recommended today, strict ER and return-to-clinic precautions discussed, patient verbalized understanding.    Wallis Bamberg, PA-C 03/09/20 1311

## 2020-03-09 NOTE — ED Triage Notes (Signed)
Pt present right eye swelling with itching. Symptoms started a week ago.

## 2021-05-01 DIAGNOSIS — J4530 Mild persistent asthma, uncomplicated: Secondary | ICD-10-CM | POA: Diagnosis not present

## 2021-05-01 DIAGNOSIS — E663 Overweight: Secondary | ICD-10-CM | POA: Diagnosis not present

## 2021-05-01 DIAGNOSIS — R69 Illness, unspecified: Secondary | ICD-10-CM | POA: Diagnosis not present

## 2021-05-01 DIAGNOSIS — M722 Plantar fascial fibromatosis: Secondary | ICD-10-CM | POA: Diagnosis not present

## 2021-05-01 DIAGNOSIS — Z136 Encounter for screening for cardiovascular disorders: Secondary | ICD-10-CM | POA: Diagnosis not present

## 2021-05-01 DIAGNOSIS — Z131 Encounter for screening for diabetes mellitus: Secondary | ICD-10-CM | POA: Diagnosis not present

## 2021-06-11 DIAGNOSIS — M6281 Muscle weakness (generalized): Secondary | ICD-10-CM | POA: Diagnosis not present

## 2021-06-11 DIAGNOSIS — M25572 Pain in left ankle and joints of left foot: Secondary | ICD-10-CM | POA: Diagnosis not present

## 2021-06-11 DIAGNOSIS — M25672 Stiffness of left ankle, not elsewhere classified: Secondary | ICD-10-CM | POA: Diagnosis not present

## 2021-06-11 DIAGNOSIS — M25675 Stiffness of left foot, not elsewhere classified: Secondary | ICD-10-CM | POA: Diagnosis not present

## 2021-06-17 DIAGNOSIS — M25572 Pain in left ankle and joints of left foot: Secondary | ICD-10-CM | POA: Diagnosis not present

## 2021-06-17 DIAGNOSIS — M25672 Stiffness of left ankle, not elsewhere classified: Secondary | ICD-10-CM | POA: Diagnosis not present

## 2021-06-17 DIAGNOSIS — M6281 Muscle weakness (generalized): Secondary | ICD-10-CM | POA: Diagnosis not present

## 2021-06-17 DIAGNOSIS — M25675 Stiffness of left foot, not elsewhere classified: Secondary | ICD-10-CM | POA: Diagnosis not present
